# Patient Record
Sex: Male | Born: 1950 | Race: White | Hispanic: No | Marital: Married | Smoking: Current every day smoker
Health system: Southern US, Community
[De-identification: ages and names within clinical notes are randomized; demographics above are authoritative.]

## PROBLEM LIST (undated history)

## (undated) DIAGNOSIS — C4491 Basal cell carcinoma of skin, unspecified: Secondary | ICD-10-CM

## (undated) DIAGNOSIS — I10 Essential (primary) hypertension: Secondary | ICD-10-CM

## (undated) HISTORY — DX: Basal cell carcinoma of skin, unspecified: C44.91

## (undated) HISTORY — PX: COLONOSCOPY: SHX174

## (undated) HISTORY — DX: Essential (primary) hypertension: I10

---

## 2004-09-25 HISTORY — PX: UMBILICAL HERNIA REPAIR: SHX196

## 2004-10-21 ENCOUNTER — Encounter: Admission: RE | Admit: 2004-10-21 | Discharge: 2004-10-21 | Payer: Self-pay | Admitting: General Surgery

## 2004-10-24 ENCOUNTER — Ambulatory Visit (HOSPITAL_BASED_OUTPATIENT_CLINIC_OR_DEPARTMENT_OTHER): Admission: RE | Admit: 2004-10-24 | Discharge: 2004-10-24 | Payer: Self-pay | Admitting: General Surgery

## 2004-10-24 ENCOUNTER — Ambulatory Visit (HOSPITAL_COMMUNITY): Admission: RE | Admit: 2004-10-24 | Discharge: 2004-10-24 | Payer: Self-pay | Admitting: General Surgery

## 2011-08-15 ENCOUNTER — Encounter: Payer: Self-pay | Admitting: Internal Medicine

## 2011-08-24 ENCOUNTER — Ambulatory Visit (AMBULATORY_SURGERY_CENTER): Payer: BC Managed Care – PPO

## 2011-08-24 VITALS — Ht 68.0 in | Wt 163.0 lb

## 2011-08-24 DIAGNOSIS — Z1211 Encounter for screening for malignant neoplasm of colon: Secondary | ICD-10-CM

## 2011-08-24 MED ORDER — PEG-KCL-NACL-NASULF-NA ASC-C 100 G PO SOLR: 1.0000 | Freq: Once | ORAL | Status: DC

## 2011-08-25 ENCOUNTER — Encounter: Payer: Self-pay | Admitting: Internal Medicine

## 2011-09-07 ENCOUNTER — Encounter: Payer: Self-pay | Admitting: Internal Medicine

## 2011-09-07 ENCOUNTER — Ambulatory Visit (AMBULATORY_SURGERY_CENTER): Payer: BC Managed Care – PPO | Admitting: Internal Medicine

## 2011-09-07 VITALS — BP 130/67 | HR 62 | Temp 97.0°F | Resp 20 | Ht 68.0 in | Wt 163.0 lb

## 2011-09-07 DIAGNOSIS — D126 Benign neoplasm of colon, unspecified: Secondary | ICD-10-CM

## 2011-09-07 DIAGNOSIS — D128 Benign neoplasm of rectum: Secondary | ICD-10-CM

## 2011-09-07 DIAGNOSIS — D129 Benign neoplasm of anus and anal canal: Secondary | ICD-10-CM

## 2011-09-07 DIAGNOSIS — Z1211 Encounter for screening for malignant neoplasm of colon: Secondary | ICD-10-CM

## 2011-09-07 MED ORDER — SODIUM CHLORIDE 0.9 % IV SOLN: 500.0000 mL | INTRAVENOUS | Status: DC

## 2011-09-07 NOTE — Patient Instructions (Addendum)
Discharge instructions given with verbal understanding. Handout on polyps given. Resume previous medications. 

## 2011-09-07 NOTE — Progress Notes (Signed)
Patient did not experience any of the following events: a burn prior to discharge; a fall within the facility; wrong site/side/patient/procedure/implant event; or a hospital transfer or hospital admission upon discharge from the facility. (G8907) Patient did not have preoperative order for IV antibiotic SSI prophylaxis. (G8918)  

## 2011-09-07 NOTE — Op Note (Signed)
[facility] [address] [place], [place]  [address]  COLONOSCOPY PROCEDURE REPORT  PATIENT:  Duane Webster, Duane Webster  MR#:  [id number] BIRTHDATE:  1950-12-25, 60 yrs. old  GENDER:  male ENDOSCOPIST:  Hedwig Morton. Juanda Chance, MD REF. BY:  Lonie Peak, PA PROCEDURE DATE:  09/07/2011 PROCEDURE:  Colonoscopy with biopsy ASA CLASS:  Class I INDICATIONS:  colorectal cancer screening, average risk colon 10 years ago in [place]-?polyp MEDICATIONS:   These medications were titrated to patient response per physician's verbal order, Versed 6 mg, Fentanyl 50 mcg  DESCRIPTION OF PROCEDURE:   After the risks and benefits and of the procedure were explained, informed consent was obtained. Digital rectal exam was performed and revealed no rectal masses. The LB CF-[id number] endoscope was introduced through the anus and advanced to the cecum, which was identified by both the appendix and ileocecal valve.  The quality of the prep was good, using MoviPrep.  The instrument was then slowly withdrawn as the colon was fully examined. <<PROCEDUREIMAGES>>  FINDINGS:  A diminutive polyp was found in the rectum. 2-3 mm polyp The polyp was removed using cold biopsy forceps (see image5).  This was otherwise a normal examination of the colon (see image4, image3, image2, and image1).   Retroflexed views in the rectum revealed no abnormalities.    The scope was then withdrawn from the patient and the procedure completed.  COMPLICATIONS:  None ENDOSCOPIC IMPRESSION: 1) Diminutive polyp in the rectum 2) Otherwise normal examination RECOMMENDATIONS: 1) Await pathology results 2) High fiber diet.  REPEAT EXAM:  In 10 year(s) for.  ______________________________ Hedwig Morton. Juanda Chance, MD  CC:  n. eSIGNED:   Hedwig Morton. Krizia Flight at 09/07/2011 09:07 AM  Larwance Sachs, [phone]

## 2011-09-08 ENCOUNTER — Telehealth: Payer: Self-pay | Admitting: *Deleted

## 2011-09-08 NOTE — Telephone Encounter (Signed)

## 2011-09-13 ENCOUNTER — Encounter: Payer: Self-pay | Admitting: Internal Medicine

## 2016-08-02 ENCOUNTER — Encounter: Payer: Self-pay | Admitting: Gastroenterology

## 2016-08-31 DIAGNOSIS — H5200 Hypermetropia, unspecified eye: Secondary | ICD-10-CM | POA: Diagnosis not present

## 2016-08-31 DIAGNOSIS — Z0100 Encounter for examination of eyes and vision without abnormal findings: Secondary | ICD-10-CM | POA: Diagnosis not present

## 2016-08-31 DIAGNOSIS — H2513 Age-related nuclear cataract, bilateral: Secondary | ICD-10-CM | POA: Diagnosis not present

## 2017-02-01 DIAGNOSIS — D1801 Hemangioma of skin and subcutaneous tissue: Secondary | ICD-10-CM | POA: Diagnosis not present

## 2017-02-01 DIAGNOSIS — L821 Other seborrheic keratosis: Secondary | ICD-10-CM | POA: Diagnosis not present

## 2017-02-01 DIAGNOSIS — Z85828 Personal history of other malignant neoplasm of skin: Secondary | ICD-10-CM | POA: Diagnosis not present

## 2017-02-01 DIAGNOSIS — D2261 Melanocytic nevi of right upper limb, including shoulder: Secondary | ICD-10-CM | POA: Diagnosis not present

## 2017-02-01 DIAGNOSIS — D692 Other nonthrombocytopenic purpura: Secondary | ICD-10-CM | POA: Diagnosis not present

## 2017-02-01 DIAGNOSIS — L570 Actinic keratosis: Secondary | ICD-10-CM | POA: Diagnosis not present

## 2017-05-09 DIAGNOSIS — Z125 Encounter for screening for malignant neoplasm of prostate: Secondary | ICD-10-CM | POA: Diagnosis not present

## 2017-05-09 DIAGNOSIS — Z139 Encounter for screening, unspecified: Secondary | ICD-10-CM | POA: Diagnosis not present

## 2017-05-09 DIAGNOSIS — I10 Essential (primary) hypertension: Secondary | ICD-10-CM | POA: Diagnosis not present

## 2017-05-09 DIAGNOSIS — Z23 Encounter for immunization: Secondary | ICD-10-CM | POA: Diagnosis not present

## 2017-05-09 DIAGNOSIS — Z6828 Body mass index (BMI) 28.0-28.9, adult: Secondary | ICD-10-CM | POA: Diagnosis not present

## 2017-05-09 DIAGNOSIS — F17200 Nicotine dependence, unspecified, uncomplicated: Secondary | ICD-10-CM | POA: Diagnosis not present

## 2017-05-09 DIAGNOSIS — Z9181 History of falling: Secondary | ICD-10-CM | POA: Diagnosis not present

## 2017-05-09 DIAGNOSIS — Z0000 Encounter for general adult medical examination without abnormal findings: Secondary | ICD-10-CM | POA: Diagnosis not present

## 2017-05-09 DIAGNOSIS — E663 Overweight: Secondary | ICD-10-CM | POA: Diagnosis not present

## 2017-05-09 DIAGNOSIS — Z136 Encounter for screening for cardiovascular disorders: Secondary | ICD-10-CM | POA: Diagnosis not present

## 2017-05-09 DIAGNOSIS — Z1159 Encounter for screening for other viral diseases: Secondary | ICD-10-CM | POA: Diagnosis not present

## 2017-05-23 ENCOUNTER — Encounter: Payer: Self-pay | Admitting: Gastroenterology

## 2017-07-10 ENCOUNTER — Encounter: Payer: Self-pay | Admitting: Gastroenterology

## 2017-07-10 ENCOUNTER — Ambulatory Visit (AMBULATORY_SURGERY_CENTER): Payer: Self-pay

## 2017-07-10 VITALS — Ht 68.0 in | Wt 186.4 lb

## 2017-07-10 DIAGNOSIS — Z86010 Personal history of colonic polyps: Secondary | ICD-10-CM

## 2017-07-10 MED ORDER — NA SULFATE-K SULFATE-MG SULF 17.5-3.13-1.6 GM/177ML PO SOLN: ORAL | 0 refills | Status: DC

## 2017-07-10 NOTE — Progress Notes (Signed)
Per pt, no allergies to soy or egg products.Pt not taking any weight loss meds or using  O2 at home.   Pt refused Emmi video. 

## 2017-07-20 DIAGNOSIS — Z720 Tobacco use: Secondary | ICD-10-CM | POA: Diagnosis not present

## 2017-07-20 DIAGNOSIS — Z6829 Body mass index (BMI) 29.0-29.9, adult: Secondary | ICD-10-CM | POA: Diagnosis not present

## 2017-07-20 DIAGNOSIS — I10 Essential (primary) hypertension: Secondary | ICD-10-CM | POA: Diagnosis not present

## 2017-07-20 DIAGNOSIS — Z1331 Encounter for screening for depression: Secondary | ICD-10-CM | POA: Diagnosis not present

## 2017-07-20 DIAGNOSIS — Z23 Encounter for immunization: Secondary | ICD-10-CM | POA: Diagnosis not present

## 2017-07-24 ENCOUNTER — Ambulatory Visit (AMBULATORY_SURGERY_CENTER): Payer: Medicare HMO | Admitting: Gastroenterology

## 2017-07-24 ENCOUNTER — Encounter: Payer: Self-pay | Admitting: Gastroenterology

## 2017-07-24 VITALS — BP 100/57 | HR 62 | Temp 97.7°F | Resp 12 | Ht 68.0 in | Wt 186.0 lb

## 2017-07-24 DIAGNOSIS — D123 Benign neoplasm of transverse colon: Secondary | ICD-10-CM | POA: Diagnosis not present

## 2017-07-24 DIAGNOSIS — D122 Benign neoplasm of ascending colon: Secondary | ICD-10-CM

## 2017-07-24 DIAGNOSIS — Z1211 Encounter for screening for malignant neoplasm of colon: Secondary | ICD-10-CM

## 2017-07-24 DIAGNOSIS — Z86010 Personal history of colonic polyps: Secondary | ICD-10-CM | POA: Diagnosis not present

## 2017-07-24 DIAGNOSIS — K635 Polyp of colon: Secondary | ICD-10-CM | POA: Diagnosis not present

## 2017-07-24 MED ORDER — SODIUM CHLORIDE 0.9 % IV SOLN: 500.0000 mL | INTRAVENOUS | Status: DC

## 2017-07-24 NOTE — Op Note (Signed)
[facility] Patient Name: Duane Webster Procedure Date: 07/24/2017 7:57 AM MRN: [id number] Endoscopist: Mauri Pole , MD Age: 66 Referring MD:  Date of Birth: 1950/11/24 Gender: Male Account #: [id number] Procedure:                Colonoscopy Indications:              Surveillance: Personal history of adenomatous                            polyps on last colonoscopy > 5 years ago Medicines:                Monitored Anesthesia Care Procedure:                Pre-Anesthesia Assessment:                           - Prior to the procedure, a History and Physical                            was performed, and patient medications and                            allergies were reviewed. The patient's tolerance of                            previous anesthesia was also reviewed. The risks                            and benefits of the procedure and the sedation                            options and risks were discussed with the patient.                            All questions were answered, and informed consent                            was obtained. Prior Anticoagulants: The patient has                            taken no previous anticoagulant or antiplatelet                            agents. ASA Grade Assessment: II - A patient with                            mild systemic disease. After reviewing the risks                            and benefits, the patient was deemed in                            satisfactory condition to undergo the procedure.  After obtaining informed consent, the colonoscope                            was passed under direct vision. Throughout the                            procedure, the patient's blood pressure, pulse, and                            oxygen saturations were monitored continuously. The                            Colonoscope was introduced through the anus and                            advanced to the the  terminal ileum, with                            identification of the appendiceal orifice and IC                            valve. The colonoscopy was performed without                            difficulty. The patient tolerated the procedure                            well. The quality of the bowel preparation was                            excellent. The terminal ileum, ileocecal valve,                            appendiceal orifice, and rectum were photographed. Scope In: 8:18:15 AM Scope Out: 8:31:57 AM Scope Withdrawal Time: 0 hours 11 minutes 2 seconds  Total Procedure Duration: 0 hours 13 minutes 42 seconds  Findings:                 The perianal and digital rectal examinations were                            normal.                           A 5 mm polyp was found in the ascending colon. The                            polyp was sessile. The polyp was removed with a                            cold snare. Resection and retrieval were complete.                           A 2 mm polyp was found in the transverse colon.  The                            polyp was sessile. The polyp was removed with a                            cold biopsy forceps. Resection and retrieval were                            complete.                           A few small-mouthed diverticula were found in the                            sigmoid colon, descending colon, transverse colon,                            ascending colon and cecum.                           Non-bleeding internal hemorrhoids were found during                            retroflexion. The hemorrhoids were small.                           The exam was otherwise without abnormality. Complications:            No immediate complications. Estimated Blood Loss:     Estimated blood loss was minimal. Impression:               - One 5 mm polyp in the ascending colon, removed                            with a cold snare. Resected and retrieved.                            - One 2 mm polyp in the transverse colon, removed                            with a cold biopsy forceps. Resected and retrieved.                           - Mild diverticulosis in the sigmoid colon, in the                            descending colon, in the transverse colon, in the                            ascending colon and in the cecum.                           - Non-bleeding internal hemorrhoids.                           -  The examination was otherwise normal. Recommendation:           - Patient has a contact number available for                            emergencies. The signs and symptoms of potential                            delayed complications were discussed with the                            patient. Return to normal activities tomorrow.                            Written discharge instructions were provided to the                            patient.                           - Resume previous diet.                           - Continue present medications.                           - Await pathology results.                           - Repeat colonoscopy in 5-10 years for surveillance                            based on pathology results. Mauri Pole, MD 07/24/2017 8:37:37 AM This report has been signed electronically.

## 2017-07-24 NOTE — Progress Notes (Signed)
Pt's states no medical or surgical changes since previsit or office visit. 

## 2017-07-24 NOTE — Progress Notes (Signed)
To PACU, VSS. Report to RN.tb 

## 2017-07-24 NOTE — Patient Instructions (Signed)
   INFORMATION ON POLYPS,DIVERTICULOSIS ,AND HEMORRHOIDS GIVEN TO YOU TODAY  AWAIT PATHOLOGY REPORT ON POLYPS REMOVED    YOU HAD AN ENDOSCOPIC PROCEDURE TODAY AT [facility]:   Refer to the procedure report that was given to you for any specific questions about what was found during the examination.  If the procedure report does not answer your questions, please call your gastroenterologist to clarify.  If you requested that your care partner not be given the details of your procedure findings, then the procedure report has been included in a sealed envelope for you to review at your convenience later.  YOU SHOULD EXPECT: Some feelings of bloating in the abdomen. Passage of more gas than usual.  Walking can help get rid of the air that was put into your GI tract during the procedure and reduce the bloating. If you had a lower endoscopy (such as a colonoscopy or flexible sigmoidoscopy) you may notice spotting of blood in your stool or on the toilet paper. If you underwent a bowel prep for your procedure, you may not have a normal bowel movement for a few days.  Please Note:  You might notice some irritation and congestion in your nose or some drainage.  This is from the oxygen used during your procedure.  There is no need for concern and it should clear up in a day or so.  SYMPTOMS TO REPORT IMMEDIATELY:   Following lower endoscopy (colonoscopy or flexible sigmoidoscopy):  Excessive amounts of blood in the stool  Significant tenderness or worsening of abdominal pains  Swelling of the abdomen that is new, acute  Fever of 100F or higher    For urgent or emergent issues, a gastroenterologist can be reached at any hour by calling [phone].   DIET:  We do recommend a small meal at first, but then you may proceed to your regular diet.  Drink plenty of fluids but you should avoid alcoholic beverages for 24 hours.  ACTIVITY:  You should plan to take it easy for the rest of  today and you should NOT DRIVE or use heavy machinery until tomorrow (because of the sedation medicines used during the test).    FOLLOW UP: Our staff will call the number listed on your records the next business day following your procedure to check on you and address any questions or concerns that you may have regarding the information given to you following your procedure. If we do not reach you, we will leave a message.  However, if you are feeling well and you are not experiencing any problems, there is no need to return our call.  We will assume that you have returned to your regular daily activities without incident.  If any biopsies were taken you will be contacted by phone or by letter within the next 1-3 weeks.  Please call us at [phone] if you have not heard about the biopsies in 3 weeks.    SIGNATURES/CONFIDENTIALITY: You and/or your care partner have signed paperwork which will be entered into your electronic medical record.  These signatures attest to the fact that that the information above on your After Visit Summary has been reviewed and is understood.  Full responsibility of the confidentiality of this discharge information lies with you and/or your care-partner.

## 2017-07-24 NOTE — Progress Notes (Signed)
CRNA,T Gwenlyn Found  REPORTED PT HAD RARE pvc before and during procedure. Strip given to pt and encouraged pt and wife to report this to PCP.

## 2017-07-24 NOTE — Progress Notes (Signed)
Called to room to assist during endoscopic procedure.  Patient ID and intended procedure confirmed with present staff. Received instructions for my participation in the procedure from the performing physician.  

## 2017-07-25 ENCOUNTER — Telehealth: Payer: Self-pay

## 2017-07-25 NOTE — Telephone Encounter (Signed)
  Follow up Call-  Call back number 07/24/2017  Post procedure Call Back phone  # [phone]  Permission to leave phone message Yes  Some recent data might be hidden     Patient questions:  Do you have a fever, pain , or abdominal swelling? No. Pain Score  0 *  Have you tolerated food without any problems? Yes.    Have you been able to return to your normal activities? Yes.    Do you have any questions about your discharge instructions: Diet   No. Medications  No. Follow up visit  No.  Do you have questions or concerns about your Care? No.  Actions: * If pain score is 4 or above: No action needed, pain <4.

## 2017-08-03 ENCOUNTER — Encounter: Payer: Self-pay | Admitting: Gastroenterology

## 2017-08-07 ENCOUNTER — Telehealth: Payer: Self-pay

## 2017-08-07 NOTE — Telephone Encounter (Signed)
Contacted the office of [place], [place]. Request for a re-read or review of the specimen from accession number [id number]. The reading physician Dr Enid Cutter is off and expected to return next week.

## 2017-09-27 NOTE — Telephone Encounter (Signed)
Faxed a request to the pathology office requesting a review of the specimen.

## 2018-01-18 DIAGNOSIS — R9431 Abnormal electrocardiogram [ECG] [EKG]: Secondary | ICD-10-CM | POA: Diagnosis not present

## 2018-01-18 DIAGNOSIS — Z6829 Body mass index (BMI) 29.0-29.9, adult: Secondary | ICD-10-CM | POA: Diagnosis not present

## 2018-01-18 DIAGNOSIS — I10 Essential (primary) hypertension: Secondary | ICD-10-CM | POA: Diagnosis not present

## 2018-07-29 DIAGNOSIS — Z9181 History of falling: Secondary | ICD-10-CM | POA: Diagnosis not present

## 2018-07-29 DIAGNOSIS — Z6830 Body mass index (BMI) 30.0-30.9, adult: Secondary | ICD-10-CM | POA: Diagnosis not present

## 2018-07-29 DIAGNOSIS — Z87891 Personal history of nicotine dependence: Secondary | ICD-10-CM | POA: Diagnosis not present

## 2018-07-29 DIAGNOSIS — Z139 Encounter for screening, unspecified: Secondary | ICD-10-CM | POA: Diagnosis not present

## 2018-07-29 DIAGNOSIS — Z23 Encounter for immunization: Secondary | ICD-10-CM | POA: Diagnosis not present

## 2018-07-29 DIAGNOSIS — Z125 Encounter for screening for malignant neoplasm of prostate: Secondary | ICD-10-CM | POA: Diagnosis not present

## 2018-07-29 DIAGNOSIS — Z1339 Encounter for screening examination for other mental health and behavioral disorders: Secondary | ICD-10-CM | POA: Diagnosis not present

## 2018-07-29 DIAGNOSIS — Z1331 Encounter for screening for depression: Secondary | ICD-10-CM | POA: Diagnosis not present

## 2018-07-29 DIAGNOSIS — I10 Essential (primary) hypertension: Secondary | ICD-10-CM | POA: Diagnosis not present

## 2018-08-08 DIAGNOSIS — Z6829 Body mass index (BMI) 29.0-29.9, adult: Secondary | ICD-10-CM | POA: Diagnosis not present

## 2018-08-08 DIAGNOSIS — J069 Acute upper respiratory infection, unspecified: Secondary | ICD-10-CM | POA: Diagnosis not present

## 2019-03-20 DIAGNOSIS — I2510 Atherosclerotic heart disease of native coronary artery without angina pectoris: Secondary | ICD-10-CM | POA: Diagnosis not present

## 2019-03-20 DIAGNOSIS — F0390 Unspecified dementia without behavioral disturbance: Secondary | ICD-10-CM | POA: Diagnosis not present

## 2019-03-20 DIAGNOSIS — I10 Essential (primary) hypertension: Secondary | ICD-10-CM | POA: Diagnosis not present

## 2019-03-20 DIAGNOSIS — I4891 Unspecified atrial fibrillation: Secondary | ICD-10-CM | POA: Diagnosis not present

## 2019-03-20 DIAGNOSIS — D509 Iron deficiency anemia, unspecified: Secondary | ICD-10-CM | POA: Diagnosis not present

## 2019-03-20 DIAGNOSIS — R32 Unspecified urinary incontinence: Secondary | ICD-10-CM | POA: Diagnosis not present

## 2019-03-20 DIAGNOSIS — G250 Essential tremor: Secondary | ICD-10-CM | POA: Diagnosis not present

## 2019-04-08 DIAGNOSIS — I2510 Atherosclerotic heart disease of native coronary artery without angina pectoris: Secondary | ICD-10-CM | POA: Diagnosis not present

## 2019-04-08 DIAGNOSIS — I700 Atherosclerosis of aorta: Secondary | ICD-10-CM | POA: Diagnosis not present

## 2019-04-08 DIAGNOSIS — F17210 Nicotine dependence, cigarettes, uncomplicated: Secondary | ICD-10-CM | POA: Diagnosis not present

## 2019-04-08 DIAGNOSIS — Z87891 Personal history of nicotine dependence: Secondary | ICD-10-CM | POA: Diagnosis not present

## 2019-04-08 DIAGNOSIS — J439 Emphysema, unspecified: Secondary | ICD-10-CM | POA: Diagnosis not present

## 2019-07-18 DIAGNOSIS — H5203 Hypermetropia, bilateral: Secondary | ICD-10-CM | POA: Diagnosis not present

## 2019-11-11 DIAGNOSIS — Z9181 History of falling: Secondary | ICD-10-CM | POA: Diagnosis not present

## 2019-11-11 DIAGNOSIS — Z87891 Personal history of nicotine dependence: Secondary | ICD-10-CM | POA: Diagnosis not present

## 2019-11-11 DIAGNOSIS — Z6832 Body mass index (BMI) 32.0-32.9, adult: Secondary | ICD-10-CM | POA: Diagnosis not present

## 2019-11-11 DIAGNOSIS — Z139 Encounter for screening, unspecified: Secondary | ICD-10-CM | POA: Diagnosis not present

## 2019-11-11 DIAGNOSIS — I10 Essential (primary) hypertension: Secondary | ICD-10-CM | POA: Diagnosis not present

## 2019-11-11 DIAGNOSIS — Z1331 Encounter for screening for depression: Secondary | ICD-10-CM | POA: Diagnosis not present

## 2019-11-11 DIAGNOSIS — Z125 Encounter for screening for malignant neoplasm of prostate: Secondary | ICD-10-CM | POA: Diagnosis not present

## 2019-11-11 DIAGNOSIS — Z23 Encounter for immunization: Secondary | ICD-10-CM | POA: Diagnosis not present

## 2019-12-08 DIAGNOSIS — Z125 Encounter for screening for malignant neoplasm of prostate: Secondary | ICD-10-CM | POA: Diagnosis not present

## 2019-12-08 DIAGNOSIS — Z6832 Body mass index (BMI) 32.0-32.9, adult: Secondary | ICD-10-CM | POA: Diagnosis not present

## 2019-12-08 DIAGNOSIS — Z1331 Encounter for screening for depression: Secondary | ICD-10-CM | POA: Diagnosis not present

## 2019-12-08 DIAGNOSIS — Z0000 Encounter for general adult medical examination without abnormal findings: Secondary | ICD-10-CM | POA: Diagnosis not present

## 2019-12-08 DIAGNOSIS — Z9181 History of falling: Secondary | ICD-10-CM | POA: Diagnosis not present

## 2019-12-08 DIAGNOSIS — E785 Hyperlipidemia, unspecified: Secondary | ICD-10-CM | POA: Diagnosis not present

## 2020-03-17 DIAGNOSIS — Z6831 Body mass index (BMI) 31.0-31.9, adult: Secondary | ICD-10-CM | POA: Diagnosis not present

## 2020-03-17 DIAGNOSIS — L989 Disorder of the skin and subcutaneous tissue, unspecified: Secondary | ICD-10-CM | POA: Diagnosis not present

## 2020-03-31 DIAGNOSIS — L905 Scar conditions and fibrosis of skin: Secondary | ICD-10-CM | POA: Diagnosis not present

## 2020-03-31 DIAGNOSIS — L821 Other seborrheic keratosis: Secondary | ICD-10-CM | POA: Diagnosis not present

## 2020-03-31 DIAGNOSIS — L718 Other rosacea: Secondary | ICD-10-CM | POA: Diagnosis not present

## 2020-03-31 DIAGNOSIS — L570 Actinic keratosis: Secondary | ICD-10-CM | POA: Diagnosis not present

## 2020-03-31 DIAGNOSIS — L814 Other melanin hyperpigmentation: Secondary | ICD-10-CM | POA: Diagnosis not present

## 2020-03-31 DIAGNOSIS — D225 Melanocytic nevi of trunk: Secondary | ICD-10-CM | POA: Diagnosis not present

## 2020-03-31 DIAGNOSIS — Z85828 Personal history of other malignant neoplasm of skin: Secondary | ICD-10-CM | POA: Diagnosis not present

## 2020-06-14 DIAGNOSIS — Z23 Encounter for immunization: Secondary | ICD-10-CM | POA: Diagnosis not present

## 2020-06-14 DIAGNOSIS — I10 Essential (primary) hypertension: Secondary | ICD-10-CM | POA: Diagnosis not present

## 2020-06-14 DIAGNOSIS — Z87891 Personal history of nicotine dependence: Secondary | ICD-10-CM | POA: Diagnosis not present

## 2020-06-14 DIAGNOSIS — Z6831 Body mass index (BMI) 31.0-31.9, adult: Secondary | ICD-10-CM | POA: Diagnosis not present

## 2020-06-28 DIAGNOSIS — J432 Centrilobular emphysema: Secondary | ICD-10-CM | POA: Diagnosis not present

## 2020-06-28 DIAGNOSIS — J439 Emphysema, unspecified: Secondary | ICD-10-CM | POA: Diagnosis not present

## 2020-06-28 DIAGNOSIS — I2510 Atherosclerotic heart disease of native coronary artery without angina pectoris: Secondary | ICD-10-CM | POA: Diagnosis not present

## 2020-06-28 DIAGNOSIS — I700 Atherosclerosis of aorta: Secondary | ICD-10-CM | POA: Diagnosis not present

## 2020-06-28 DIAGNOSIS — F17210 Nicotine dependence, cigarettes, uncomplicated: Secondary | ICD-10-CM | POA: Diagnosis not present

## 2020-12-09 DIAGNOSIS — Z139 Encounter for screening, unspecified: Secondary | ICD-10-CM | POA: Diagnosis not present

## 2020-12-09 DIAGNOSIS — Z9181 History of falling: Secondary | ICD-10-CM | POA: Diagnosis not present

## 2020-12-09 DIAGNOSIS — Z1321 Encounter for screening for nutritional disorder: Secondary | ICD-10-CM | POA: Diagnosis not present

## 2020-12-09 DIAGNOSIS — Z0000 Encounter for general adult medical examination without abnormal findings: Secondary | ICD-10-CM | POA: Diagnosis not present

## 2020-12-09 DIAGNOSIS — Z1331 Encounter for screening for depression: Secondary | ICD-10-CM | POA: Diagnosis not present

## 2020-12-09 DIAGNOSIS — E785 Hyperlipidemia, unspecified: Secondary | ICD-10-CM | POA: Diagnosis not present

## 2020-12-28 DIAGNOSIS — B351 Tinea unguium: Secondary | ICD-10-CM | POA: Diagnosis not present

## 2020-12-28 DIAGNOSIS — Z6831 Body mass index (BMI) 31.0-31.9, adult: Secondary | ICD-10-CM | POA: Diagnosis not present

## 2020-12-28 DIAGNOSIS — Z87891 Personal history of nicotine dependence: Secondary | ICD-10-CM | POA: Diagnosis not present

## 2020-12-28 DIAGNOSIS — I10 Essential (primary) hypertension: Secondary | ICD-10-CM | POA: Diagnosis not present

## 2021-02-08 DIAGNOSIS — Z79899 Other long term (current) drug therapy: Secondary | ICD-10-CM | POA: Diagnosis not present

## 2021-05-05 DIAGNOSIS — L821 Other seborrheic keratosis: Secondary | ICD-10-CM | POA: Diagnosis not present

## 2021-05-05 DIAGNOSIS — Z85828 Personal history of other malignant neoplasm of skin: Secondary | ICD-10-CM | POA: Diagnosis not present

## 2021-05-05 DIAGNOSIS — D229 Melanocytic nevi, unspecified: Secondary | ICD-10-CM | POA: Diagnosis not present

## 2021-05-05 DIAGNOSIS — L819 Disorder of pigmentation, unspecified: Secondary | ICD-10-CM | POA: Diagnosis not present

## 2021-05-05 DIAGNOSIS — L905 Scar conditions and fibrosis of skin: Secondary | ICD-10-CM | POA: Diagnosis not present

## 2021-05-05 DIAGNOSIS — L570 Actinic keratosis: Secondary | ICD-10-CM | POA: Diagnosis not present

## 2021-06-30 DIAGNOSIS — Z23 Encounter for immunization: Secondary | ICD-10-CM | POA: Diagnosis not present

## 2021-06-30 DIAGNOSIS — Z87891 Personal history of nicotine dependence: Secondary | ICD-10-CM | POA: Diagnosis not present

## 2021-06-30 DIAGNOSIS — Z6830 Body mass index (BMI) 30.0-30.9, adult: Secondary | ICD-10-CM | POA: Diagnosis not present

## 2021-06-30 DIAGNOSIS — I10 Essential (primary) hypertension: Secondary | ICD-10-CM | POA: Diagnosis not present

## 2021-06-30 DIAGNOSIS — Z125 Encounter for screening for malignant neoplasm of prostate: Secondary | ICD-10-CM | POA: Diagnosis not present

## 2021-08-04 DIAGNOSIS — R972 Elevated prostate specific antigen [PSA]: Secondary | ICD-10-CM | POA: Diagnosis not present

## 2021-08-17 DIAGNOSIS — Z122 Encounter for screening for malignant neoplasm of respiratory organs: Secondary | ICD-10-CM | POA: Diagnosis not present

## 2021-08-17 DIAGNOSIS — Z87891 Personal history of nicotine dependence: Secondary | ICD-10-CM | POA: Diagnosis not present

## 2021-08-17 DIAGNOSIS — F17210 Nicotine dependence, cigarettes, uncomplicated: Secondary | ICD-10-CM | POA: Diagnosis not present

## 2021-09-22 DIAGNOSIS — N402 Nodular prostate without lower urinary tract symptoms: Secondary | ICD-10-CM | POA: Diagnosis not present

## 2021-09-22 DIAGNOSIS — R972 Elevated prostate specific antigen [PSA]: Secondary | ICD-10-CM | POA: Diagnosis not present

## 2021-10-20 DIAGNOSIS — S46211A Strain of muscle, fascia and tendon of other parts of biceps, right arm, initial encounter: Secondary | ICD-10-CM | POA: Diagnosis not present

## 2021-10-24 DIAGNOSIS — M25521 Pain in right elbow: Secondary | ICD-10-CM | POA: Diagnosis not present

## 2021-10-28 DIAGNOSIS — C61 Malignant neoplasm of prostate: Secondary | ICD-10-CM | POA: Diagnosis not present

## 2021-10-28 DIAGNOSIS — R972 Elevated prostate specific antigen [PSA]: Secondary | ICD-10-CM | POA: Diagnosis not present

## 2021-11-04 DIAGNOSIS — N402 Nodular prostate without lower urinary tract symptoms: Secondary | ICD-10-CM | POA: Diagnosis not present

## 2021-11-04 DIAGNOSIS — C61 Malignant neoplasm of prostate: Secondary | ICD-10-CM | POA: Diagnosis not present

## 2021-11-07 DIAGNOSIS — S46211A Strain of muscle, fascia and tendon of other parts of biceps, right arm, initial encounter: Secondary | ICD-10-CM | POA: Diagnosis not present

## 2021-11-08 ENCOUNTER — Telehealth: Payer: Self-pay | Admitting: Radiation Oncology

## 2021-11-08 NOTE — Telephone Encounter (Signed)
Called patient to schedule a consultation w. Dr. Manning. No answer, LVM for a return call.  ?

## 2021-11-09 NOTE — Progress Notes (Signed)
GU Location of Tumor / Histology: Prostate Ca  If Prostate Cancer, Gleason Score is (4 + 3) and PSA is (5.3 as of 10/2021)  Biopsies  Dr. Gloriann Loan      Past/Anticipated interventions by urology, if any:     Past/Anticipated interventions by medical oncology, if any:   Weight changes, if any:  No  IPSS:  6 SHIM:  25  Bowel/Bladder complaints, if any:  No  Nausea/Vomiting, if any: No  Pain issues, if any:  0/10  SAFETY ISSUES: Prior radiation? No Pacemaker/ICD? No Possible current pregnancy?  Male Is the patient on methotrexate? No  Current Complaints / other details:  Need more information on treatment options.

## 2021-11-13 DIAGNOSIS — C61 Malignant neoplasm of prostate: Secondary | ICD-10-CM | POA: Insufficient documentation

## 2021-11-13 NOTE — Progress Notes (Signed)
Radiation Oncology         [phone] ________________________________  Initial Outpatient Consultation  Name: Duane Webster MRN: [id number]  Date: 11/14/2021  DOB: [birth year]  WC:HENIDP, Maia Breslow, MD   REFERRING PHYSICIAN: Lucas Mallow, MD  DIAGNOSIS: 71 y.o. gentleman with Stage T2a adenocarcinoma of the prostate with Gleason score of 3+4, and PSA of 5.3.    ICD-10-CM   1. Malignant neoplasm of prostate ([place])  C61       HISTORY OF PRESENT ILLNESS: Duane Webster is a 71 y.o. male with a diagnosis of prostate cancer. He was noted to have an elevated PSA of 5.3 by his primary care PA Cyndi Bender.  Accordingly, he was referred for evaluation in urology by Dr. Gloriann Loan on 09/22/21,  digital rectal examination was performed at that time revealing a small BB size nodule in the midline.  The patient proceeded to transrectal ultrasound with 12 biopsies of the prostate on 10/28/21.  The prostate volume measured 20.9 cc.  Out of 12 core biopsies, 3 were positive.  The maximum Gleason score was 3+4, and this was seen in the left and right apex laterally with Gleason 3+3 in the left lateral base..  The patient reviewed the biopsy results with his urologist and he has kindly been referred today for discussion of potential radiation treatment options.   PREVIOUS RADIATION THERAPY: No  PAST MEDICAL HISTORY:  Past Medical History:  Diagnosis Date   Basal cell carcinoma    right side face   Hypertension       PAST SURGICAL HISTORY: Past Surgical History:  Procedure Laterality Date   COLONOSCOPY     UMBILICAL HERNIA REPAIR  2006    FAMILY HISTORY:  Family History  Problem Relation Age of Onset   Colon cancer Neg Hx     SOCIAL HISTORY:  Social History   Socioeconomic History   Marital status: Married    Spouse name: Not on file   Number of children: Not on file   Years of education: Not on file   Highest education level: Not on file   Occupational History   Not on file  Tobacco Use   Smoking status: Every Day    Packs/day: 1.00    Types: Cigarettes   Smokeless tobacco: Never  Vaping Use   Vaping Use: Never used  Substance and Sexual Activity   Alcohol use: Yes    Alcohol/week: 4.0 standard drinks    Types: 4 Cans of beer per week   Drug use: No   Sexual activity: Not on file  Other Topics Concern   Not on file  Social History Narrative   Not on file   Social Determinants of Health   Financial Resource Strain: Not on file  Food Insecurity: Not on file  Transportation Needs: Not on file  Physical Activity: Not on file  Stress: Not on file  Social Connections: Not on file  Intimate Partner Violence: Not on file    ALLERGIES: Codeine  MEDICATIONS:  Current Outpatient Medications  Medication Sig Dispense Refill   losartan (COZAAR) 25 MG tablet Take 25 mg by mouth daily.     PFIZER COVID-19 VAC BIVALENT injection      Current Facility-Administered Medications  Medication Dose Route Frequency Provider Last Rate Last Admin   0.9 %  sodium chloride infusion  500 mL Intravenous Continuous Nandigam, Venia Minks, MD        REVIEW OF SYSTEMS:  On review of systems, the patient reports that he is doing well overall. He denies any chest pain, shortness of breath, cough, fevers, chills, night sweats, unintended weight changes. He denies any bowel disturbances, and denies abdominal pain, nausea or vomiting. He denies any new musculoskeletal or joint aches or pains. His IPSS was Total Score: 6, indicating mild urinary symptoms (Reference 0-7 mild, 8-19 moderate, 20-35 severe).  His SHIM: 25, indicating he does not have any erectile dysfunction (Reference - 22-25 None, 17-21 Mild, 8-16 Moderate, 1-7 Severe). A complete review of systems is obtained and is otherwise negative.   PHYSICAL EXAM:  Wt Readings from Last 3 Encounters:  11/14/21 203 lb (92.1 kg)  07/24/17 186 lb (84.4 kg)  07/10/17 186 lb 6.4 oz (84.6 kg)    Temp Readings from Last 3 Encounters:  11/14/21 97.7 F (36.5 C)  07/24/17 97.7 F (36.5 C)  09/07/11 97 F (36.1 C)   BP Readings from Last 3 Encounters:  11/14/21 130/68  07/24/17 (!) 100/57  09/07/11 130/67   Pulse Readings from Last 3 Encounters:  11/14/21 73  07/24/17 62  09/07/11 62   Pain Assessment Pain Score: 0-No pain/10  In general this is a well appearing gentleman in no acute distress. He's alert and oriented x4 and appropriate throughout the examination. Cardiopulmonary assessment is negative for acute distress, and he exhibits normal effort.    KPS = 100  100 - Normal; no complaints; no evidence of disease. 90   - Able to carry on normal activity; minor signs or symptoms of disease. 80   - Normal activity with effort; some signs or symptoms of disease. 38   - Cares for self; unable to carry on normal activity or to do active work. 60   - Requires occasional assistance, but is able to care for most of his personal needs. 50   - Requires considerable assistance and frequent medical care. 14   - Disabled; requires special care and assistance. 72   - Severely disabled; hospital admission is indicated although death not imminent. 51   - Very sick; hospital admission necessary; active supportive treatment necessary. 10   - Moribund; fatal processes progressing rapidly. 0     - Dead  Karnofsky DA, Abelmann WH, Craver LS and Burchenal JH [phone]) The use of the nitrogen mustards in the palliative treatment of carcinoma: with particular reference to bronchogenic carcinoma Cancer 1 634-56  LABORATORY DATA:  No results found for: WBC, HGB, HCT, MCV, PLT No results found for: NA, K, CL, CO2 No results found for: ALT, AST, GGT, ALKPHOS, BILITOT   RADIOGRAPHY: No results found.    IMPRESSION/PLAN: 1. 71 y.o. gentleman with Stage T2a adenocarcinoma of the prostate with Gleason score of 3+4, and PSA of 5.3.  We discussed the patient's workup and outlined the nature  of prostate cancer in this setting. The patient's T stage, Gleason's score, and PSA put him into the Favorable Intermediate risk group. Accordingly, he is eligible for a variety of potential treatment options including brachytherapy, 5.5 weeks of external radiation, or prostatectomy. We discussed the available radiation techniques, and focused on the details and logistics of delivery.  We discussed and outlined the risks, benefits, short and long-term effects associated with radiotherapy and compared and contrasted these with prostatectomy. We discussed the role of SpaceOAR gel in reducing the rectal toxicity associated with radiotherapy.   He appears to have a good understanding of his disease and our treatment recommendations which are of curative  intent.  He was encouraged to ask questions that were answered to his stated satisfaction.  At the conclusion of our conversation, the patient is most interested in moving forward with active surveillance.  He has follow-up scheduled with Dr. Gloriann Loan  We personally spent 60 minutes in this encounter including chart review, reviewing radiological studies, meeting face-to-face with the patient, entering orders and completing documentation.      Tyler Pita, MD  [facility]   Radiation Oncology Direct Dial: [phone]   Fax: [phone] [email or web address]   Skype   LinkedIn

## 2021-11-14 ENCOUNTER — Ambulatory Visit
Admission: RE | Admit: 2021-11-14 | Discharge: 2021-11-14 | Disposition: A | Discharge status: Home / Self Care | Payer: Medicare HMO | Source: Ambulatory Visit | Attending: Radiation Oncology | Admitting: Radiation Oncology

## 2021-11-14 ENCOUNTER — Other Ambulatory Visit: Payer: Self-pay

## 2021-11-14 VITALS — BP 130/68 | HR 73 | Temp 97.7°F | Resp 20 | Ht 68.0 in | Wt 203.0 lb

## 2021-11-14 DIAGNOSIS — I10 Essential (primary) hypertension: Secondary | ICD-10-CM | POA: Insufficient documentation

## 2021-11-14 DIAGNOSIS — Z08 Encounter for follow-up examination after completed treatment for malignant neoplasm: Secondary | ICD-10-CM | POA: Diagnosis not present

## 2021-11-14 DIAGNOSIS — F17210 Nicotine dependence, cigarettes, uncomplicated: Secondary | ICD-10-CM | POA: Diagnosis not present

## 2021-11-14 DIAGNOSIS — Z85828 Personal history of other malignant neoplasm of skin: Secondary | ICD-10-CM | POA: Insufficient documentation

## 2021-11-14 DIAGNOSIS — C61 Malignant neoplasm of prostate: Secondary | ICD-10-CM | POA: Insufficient documentation

## 2021-11-14 DIAGNOSIS — Z79899 Other long term (current) drug therapy: Secondary | ICD-10-CM | POA: Insufficient documentation

## 2021-11-14 NOTE — Progress Notes (Signed)
Introduced myself to the patient, and his wife, as the prostate nurse navigator.  No barriers to care identified at this time.  He is here to discuss his radiation treatment options.  I gave him my business card and asked him to call me with questions or concerns.  Verbalized understanding.  

## 2021-12-12 DIAGNOSIS — Z9181 History of falling: Secondary | ICD-10-CM | POA: Diagnosis not present

## 2021-12-12 DIAGNOSIS — Z6830 Body mass index (BMI) 30.0-30.9, adult: Secondary | ICD-10-CM | POA: Diagnosis not present

## 2021-12-12 DIAGNOSIS — Z0000 Encounter for general adult medical examination without abnormal findings: Secondary | ICD-10-CM | POA: Diagnosis not present

## 2021-12-12 DIAGNOSIS — E785 Hyperlipidemia, unspecified: Secondary | ICD-10-CM | POA: Diagnosis not present

## 2021-12-12 DIAGNOSIS — Z1331 Encounter for screening for depression: Secondary | ICD-10-CM | POA: Diagnosis not present

## 2021-12-12 DIAGNOSIS — E669 Obesity, unspecified: Secondary | ICD-10-CM | POA: Diagnosis not present

## 2021-12-16 DIAGNOSIS — R351 Nocturia: Secondary | ICD-10-CM | POA: Diagnosis not present

## 2021-12-16 DIAGNOSIS — R3915 Urgency of urination: Secondary | ICD-10-CM | POA: Diagnosis not present

## 2021-12-16 DIAGNOSIS — C61 Malignant neoplasm of prostate: Secondary | ICD-10-CM | POA: Diagnosis not present

## 2022-02-03 ENCOUNTER — Other Ambulatory Visit: Payer: Self-pay | Admitting: Urology

## 2022-02-03 DIAGNOSIS — R972 Elevated prostate specific antigen [PSA]: Secondary | ICD-10-CM

## 2022-02-15 DIAGNOSIS — Z86010 Personal history of colonic polyps: Secondary | ICD-10-CM | POA: Diagnosis not present

## 2022-02-15 DIAGNOSIS — C61 Malignant neoplasm of prostate: Secondary | ICD-10-CM | POA: Diagnosis not present

## 2022-02-15 DIAGNOSIS — I10 Essential (primary) hypertension: Secondary | ICD-10-CM | POA: Diagnosis not present

## 2022-02-15 DIAGNOSIS — Z139 Encounter for screening, unspecified: Secondary | ICD-10-CM | POA: Diagnosis not present

## 2022-03-03 ENCOUNTER — Ambulatory Visit
Admission: RE | Admit: 2022-03-03 | Discharge: 2022-03-03 | Disposition: A | Discharge status: Home / Self Care | Payer: Medicare HMO | Source: Ambulatory Visit | Attending: Urology | Admitting: Urology

## 2022-03-03 DIAGNOSIS — C61 Malignant neoplasm of prostate: Secondary | ICD-10-CM | POA: Diagnosis not present

## 2022-03-03 DIAGNOSIS — R972 Elevated prostate specific antigen [PSA]: Secondary | ICD-10-CM

## 2022-03-03 DIAGNOSIS — N402 Nodular prostate without lower urinary tract symptoms: Secondary | ICD-10-CM | POA: Diagnosis not present

## 2022-03-03 DIAGNOSIS — K5730 Diverticulosis of large intestine without perforation or abscess without bleeding: Secondary | ICD-10-CM | POA: Diagnosis not present

## 2022-03-03 MED ORDER — GADOBENATE DIMEGLUMINE 529 MG/ML IV SOLN
18.0000 mL | Freq: Once | INTRAVENOUS | Status: AC | PRN
  Administered 2022-03-03: 18 mL via INTRAVENOUS

## 2022-03-16 DIAGNOSIS — C61 Malignant neoplasm of prostate: Secondary | ICD-10-CM | POA: Diagnosis not present

## 2022-03-23 DIAGNOSIS — C61 Malignant neoplasm of prostate: Secondary | ICD-10-CM | POA: Diagnosis not present

## 2022-07-19 ENCOUNTER — Encounter: Payer: Self-pay | Admitting: Gastroenterology

## 2022-07-25 DIAGNOSIS — L570 Actinic keratosis: Secondary | ICD-10-CM | POA: Diagnosis not present

## 2022-07-25 DIAGNOSIS — L814 Other melanin hyperpigmentation: Secondary | ICD-10-CM | POA: Diagnosis not present

## 2022-07-25 DIAGNOSIS — Z85828 Personal history of other malignant neoplasm of skin: Secondary | ICD-10-CM | POA: Diagnosis not present

## 2022-07-25 DIAGNOSIS — L821 Other seborrheic keratosis: Secondary | ICD-10-CM | POA: Diagnosis not present

## 2022-07-25 DIAGNOSIS — Z08 Encounter for follow-up examination after completed treatment for malignant neoplasm: Secondary | ICD-10-CM | POA: Diagnosis not present

## 2022-07-25 DIAGNOSIS — D225 Melanocytic nevi of trunk: Secondary | ICD-10-CM | POA: Diagnosis not present

## 2022-09-05 DIAGNOSIS — H524 Presbyopia: Secondary | ICD-10-CM | POA: Diagnosis not present

## 2022-09-06 DIAGNOSIS — I10 Essential (primary) hypertension: Secondary | ICD-10-CM | POA: Diagnosis not present

## 2022-09-06 DIAGNOSIS — C61 Malignant neoplasm of prostate: Secondary | ICD-10-CM | POA: Diagnosis not present

## 2022-09-06 DIAGNOSIS — Z87891 Personal history of nicotine dependence: Secondary | ICD-10-CM | POA: Diagnosis not present

## 2022-09-06 DIAGNOSIS — Z23 Encounter for immunization: Secondary | ICD-10-CM | POA: Diagnosis not present

## 2022-09-13 DIAGNOSIS — C61 Malignant neoplasm of prostate: Secondary | ICD-10-CM | POA: Diagnosis not present

## 2022-09-20 DIAGNOSIS — C61 Malignant neoplasm of prostate: Secondary | ICD-10-CM | POA: Diagnosis not present

## 2022-09-20 DIAGNOSIS — R3915 Urgency of urination: Secondary | ICD-10-CM | POA: Diagnosis not present

## 2022-09-20 DIAGNOSIS — N403 Nodular prostate with lower urinary tract symptoms: Secondary | ICD-10-CM | POA: Diagnosis not present

## 2022-09-27 DIAGNOSIS — J432 Centrilobular emphysema: Secondary | ICD-10-CM | POA: Diagnosis not present

## 2022-09-27 DIAGNOSIS — I2510 Atherosclerotic heart disease of native coronary artery without angina pectoris: Secondary | ICD-10-CM | POA: Diagnosis not present

## 2022-09-27 DIAGNOSIS — F17210 Nicotine dependence, cigarettes, uncomplicated: Secondary | ICD-10-CM | POA: Diagnosis not present

## 2022-09-27 DIAGNOSIS — Z122 Encounter for screening for malignant neoplasm of respiratory organs: Secondary | ICD-10-CM | POA: Diagnosis not present

## 2022-09-27 DIAGNOSIS — Z87891 Personal history of nicotine dependence: Secondary | ICD-10-CM | POA: Diagnosis not present

## 2022-09-27 DIAGNOSIS — I700 Atherosclerosis of aorta: Secondary | ICD-10-CM | POA: Diagnosis not present

## 2023-01-04 DIAGNOSIS — L02213 Cutaneous abscess of chest wall: Secondary | ICD-10-CM | POA: Diagnosis not present

## 2023-01-04 DIAGNOSIS — L02811 Cutaneous abscess of head [any part, except face]: Secondary | ICD-10-CM | POA: Diagnosis not present

## 2023-03-12 DIAGNOSIS — Z139 Encounter for screening, unspecified: Secondary | ICD-10-CM | POA: Diagnosis not present

## 2023-03-12 DIAGNOSIS — L02211 Cutaneous abscess of abdominal wall: Secondary | ICD-10-CM | POA: Diagnosis not present

## 2023-03-12 DIAGNOSIS — Z9181 History of falling: Secondary | ICD-10-CM | POA: Diagnosis not present

## 2023-03-12 DIAGNOSIS — J439 Emphysema, unspecified: Secondary | ICD-10-CM | POA: Diagnosis not present

## 2023-03-12 DIAGNOSIS — I10 Essential (primary) hypertension: Secondary | ICD-10-CM | POA: Diagnosis not present

## 2023-03-12 DIAGNOSIS — Z1331 Encounter for screening for depression: Secondary | ICD-10-CM | POA: Diagnosis not present

## 2023-03-12 DIAGNOSIS — I2510 Atherosclerotic heart disease of native coronary artery without angina pectoris: Secondary | ICD-10-CM | POA: Diagnosis not present

## 2023-03-12 DIAGNOSIS — C61 Malignant neoplasm of prostate: Secondary | ICD-10-CM | POA: Diagnosis not present

## 2023-03-12 DIAGNOSIS — I700 Atherosclerosis of aorta: Secondary | ICD-10-CM | POA: Diagnosis not present

## 2023-03-19 DIAGNOSIS — C61 Malignant neoplasm of prostate: Secondary | ICD-10-CM | POA: Diagnosis not present

## 2023-04-17 DIAGNOSIS — E7800 Pure hypercholesterolemia, unspecified: Secondary | ICD-10-CM | POA: Diagnosis not present

## 2023-04-17 DIAGNOSIS — R748 Abnormal levels of other serum enzymes: Secondary | ICD-10-CM | POA: Diagnosis not present

## 2023-04-26 IMAGING — MR MR PROSTATE WO/W CM
12 series · 48 of 48 positions shown · IV contrast (multihance)
Comparison: None Available.

CLINICAL DATA: Prostate carcinoma, Gleason score 3+4=7.

EXAM:
MR PROSTATE WITHOUT AND WITH CONTRAST
TECHNIQUE: Multiplanar multisequence MRI images were obtained of the pelvis
centered about the prostate. Pre and post contrast images were
obtained.
CONTRAST:  18mL MULTIHANCE GADOBENATE DIMEGLUMINE 529 MG/ML IV SOLN

[Series 3: T2 · coronal · 3.0mm · 0.56mm/px · 1 of 23 slices shown (1 of 3)]
[im 1/23]
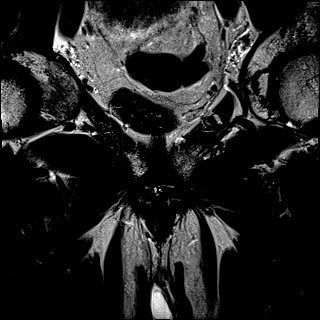

[Series 4: T1 · axial · 5.0mm · 1.25mm/px · z∈[-72,+143]mm · 2 of 88 slices shown]
[im 1/88]
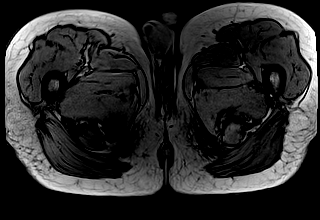
[im 88/88]
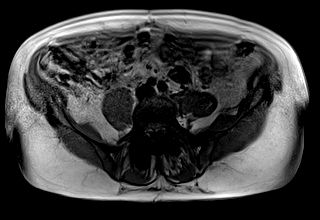

[Series 5: DWI · axial · 3.0mm · 1.75mm/px · 1 of 65 slices shown (1 of 3)]
[im 1/65]
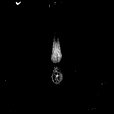

[Series 6: DWI · axial · 3.0mm · 1.75mm/px · 1 of 22 slices shown (2 of 3)]
[im 1/22]
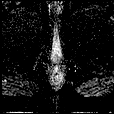

[Series 7: DWI · axial · 3.0mm · 1.75mm/px · 1 of 22 slices shown (3 of 3)]
[im 1/22]
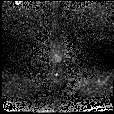

[Series 8: T2 · axial · 3.0mm · 0.56mm/px · 1 of 23 slices shown (2 of 3)]
[im 1/23]
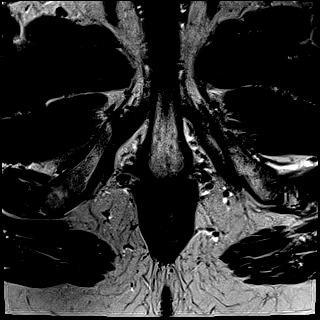

[Series 9: T2 · axial · 1.0mm · 1.04mm/px · z∈[-51,+20]mm · 2 of 72 slices shown (3 of 3)]
[im 1/72]
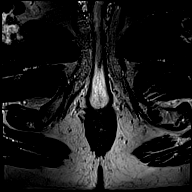
[im 72/72]
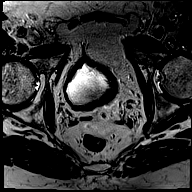

[Series 10: pre t1_twist_tra_dyn · axial · non-contrast · 3.5mm · 0.83mm/px · 1 of 20 slices shown]
[im 1/20]
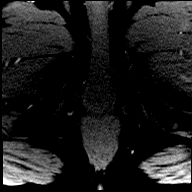

[Series 11: post t1_twist_tra_dyn-copy center · axial · non-contrast · 3.5mm · 0.83mm/px · z∈[-48,+18]mm · 17 of 600 slices shown]
[im 1/600]
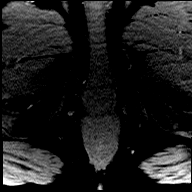
[im 38/600]
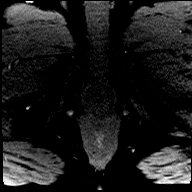
[im 75/600]
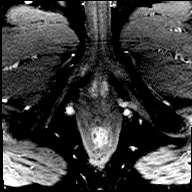
[im 113/600]
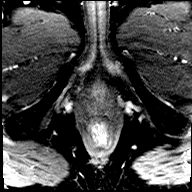
[im 150/600]
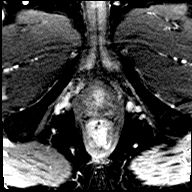
[im 188/600]
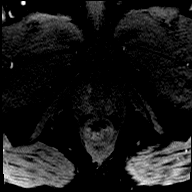
[im 225/600]
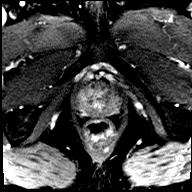
[im 263/600]
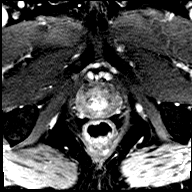
[im 300/600]
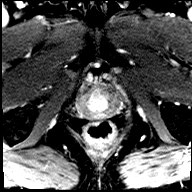
[im 337/600]
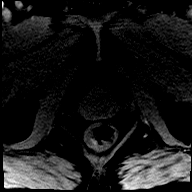
[im 375/600]
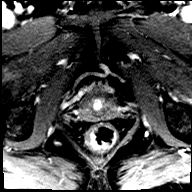
[im 412/600]
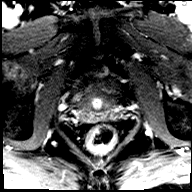
[im 450/600]
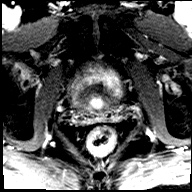
[im 487/600]
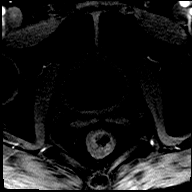
[im 525/600]
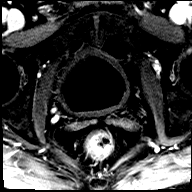
[im 562/600]
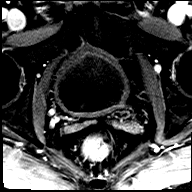
[im 600/600]
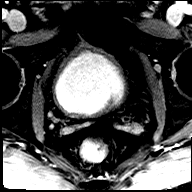

[Series 12: post t1_twist_tra_dyn-copy cent_sub · axial · 3.5mm · 0.83mm/px · z∈[-48,+18]mm · 17 of 580 slices shown]
[im 1/580]
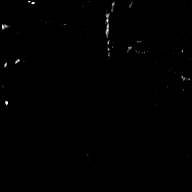
[im 37/580]
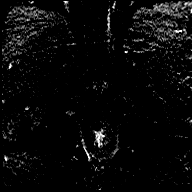
[im 73/580]
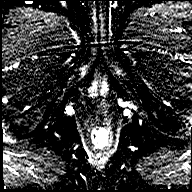
[im 109/580]
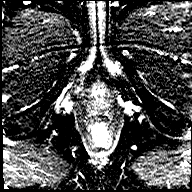
[im 145/580]
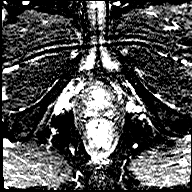
[im 181/580]
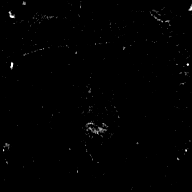
[im 218/580]
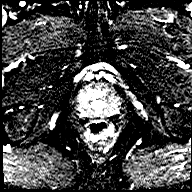
[im 254/580]
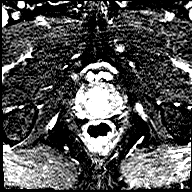
[im 290/580]
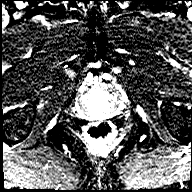
[im 326/580]
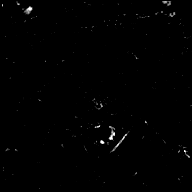
[im 362/580]
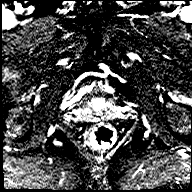
[im 399/580]
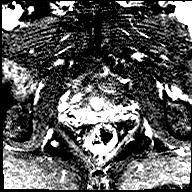
[im 435/580]
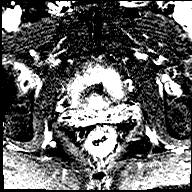
[im 471/580]
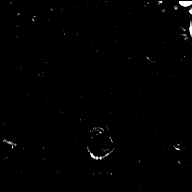
[im 507/580]
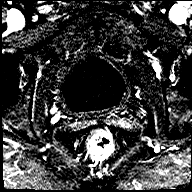
[im 543/580]
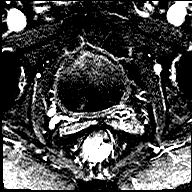
[im 580/580]
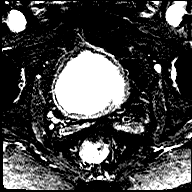

[Series 13: t1_vibe_dixon_tra_f · axial · 2.5mm · 0.91mm/px · z∈[-64,+134]mm · 2 of 80 slices shown]
[im 1/80]
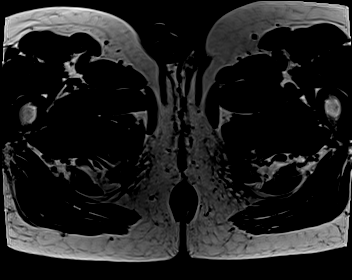
[im 80/80]
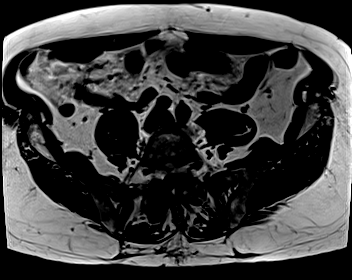

[Series 14: t1_vibe_dixon_tra_w · axial · 2.5mm · 0.91mm/px · z∈[-64,+134]mm · 2 of 80 slices shown]
[im 1/80]
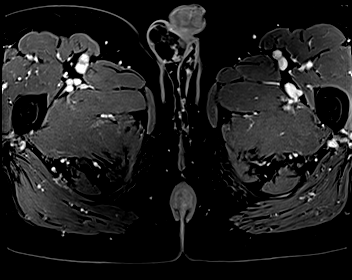
[im 80/80]
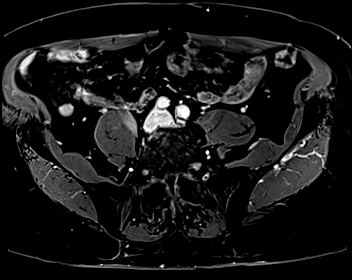

[48 of 48 positions shown; findings below may reference images not displayed]

FINDINGS: Prostate:

-- Peripheral Zone: A 7 x 7 mm T2 hypointense nodule is seen in the
right anterior mid gland (image [DATE]). This lesion shows marked ADC
hypointensity and DWI hyperintensity, as well as early focal
contrast enhancement. PI-RADS 4

An adjacent T2 hypointense nodule measuring 4 x 4 mm is seen in the
right anterior apex on image [DATE]. This lesion shows marked ADC
hypointensity and early focal contrast enhancement, but lack of DWI
hyperintensity. PI-RADS 4

A 5 x 4 mm T2 hypointense nodule is seen in the left lateral mid
gland on image 43/9. This nodule shows marked ADC hypointensity, but
lack of DWI hyperintensity or early focal contrast enhancement.
PI-RADS 3

-- Transition/Central Zone: Slightly enlarged with a few small
circumscribed BPH nodules. No non-circumscribed or otherwise
suspicious appearing nodules identified.

-- Measurements/Volume:  3.7 x 2.9 x 3.9 cm (volume = 22 cm^3)

Transcapsular spread:  Absent

Seminal vesicle involvement:  Absent

Neurovascular bundle involvement:  Absent

Pelvic adenopathy: None visualized

Bone metastasis: None visualized

Other:  Sigmoid diverticulosis, without evidence of diverticulitis.
IMPRESSION: Two adjacent sub-cm peripheral zone nodules in the right anterior
mid gland and apex, both suspicious for high-grade carcinoma.
PI-RADS 4 (v2.1): High (clinically significant cancer likely)

5 mm peripheral zone nodule in the left lateral mid gland. PI-RADS 3
(v2.1): Intermediate (clinically significant cancer equivocal)

No evidence of extracapsular extension or pelvic metastatic disease.

(I have post-processed this exam in the DynaCAD application for
potential fusion-guided biopsy.)

## 2023-05-04 DIAGNOSIS — L03119 Cellulitis of unspecified part of limb: Secondary | ICD-10-CM | POA: Diagnosis not present

## 2023-05-30 DIAGNOSIS — R7989 Other specified abnormal findings of blood chemistry: Secondary | ICD-10-CM | POA: Diagnosis not present

## 2023-05-30 DIAGNOSIS — N281 Cyst of kidney, acquired: Secondary | ICD-10-CM | POA: Diagnosis not present

## 2023-05-30 DIAGNOSIS — R748 Abnormal levels of other serum enzymes: Secondary | ICD-10-CM | POA: Diagnosis not present

## 2023-06-26 DIAGNOSIS — N492 Inflammatory disorders of scrotum: Secondary | ICD-10-CM | POA: Diagnosis not present

## 2023-06-26 DIAGNOSIS — Z23 Encounter for immunization: Secondary | ICD-10-CM | POA: Diagnosis not present

## 2023-06-26 DIAGNOSIS — L02414 Cutaneous abscess of left upper limb: Secondary | ICD-10-CM | POA: Diagnosis not present

## 2023-07-05 ENCOUNTER — Other Ambulatory Visit: Payer: Self-pay | Admitting: Nurse Practitioner

## 2023-07-05 DIAGNOSIS — C61 Malignant neoplasm of prostate: Secondary | ICD-10-CM

## 2023-07-12 DIAGNOSIS — B3749 Other urogenital candidiasis: Secondary | ICD-10-CM | POA: Diagnosis not present

## 2023-07-12 DIAGNOSIS — Z23 Encounter for immunization: Secondary | ICD-10-CM | POA: Diagnosis not present

## 2023-07-12 DIAGNOSIS — J3489 Other specified disorders of nose and nasal sinuses: Secondary | ICD-10-CM | POA: Diagnosis not present

## 2023-07-13 DIAGNOSIS — N492 Inflammatory disorders of scrotum: Secondary | ICD-10-CM | POA: Diagnosis not present

## 2023-07-16 DIAGNOSIS — L03211 Cellulitis of face: Secondary | ICD-10-CM | POA: Diagnosis not present

## 2023-07-16 DIAGNOSIS — B356 Tinea cruris: Secondary | ICD-10-CM | POA: Diagnosis not present

## 2023-07-30 DIAGNOSIS — R600 Localized edema: Secondary | ICD-10-CM | POA: Diagnosis not present

## 2023-07-30 DIAGNOSIS — L0291 Cutaneous abscess, unspecified: Secondary | ICD-10-CM | POA: Diagnosis not present

## 2023-07-30 DIAGNOSIS — I2510 Atherosclerotic heart disease of native coronary artery without angina pectoris: Secondary | ICD-10-CM | POA: Diagnosis not present

## 2023-08-09 DIAGNOSIS — Z9181 History of falling: Secondary | ICD-10-CM | POA: Diagnosis not present

## 2023-08-09 DIAGNOSIS — Z139 Encounter for screening, unspecified: Secondary | ICD-10-CM | POA: Diagnosis not present

## 2023-08-09 DIAGNOSIS — I700 Atherosclerosis of aorta: Secondary | ICD-10-CM | POA: Diagnosis not present

## 2023-08-09 DIAGNOSIS — Z1331 Encounter for screening for depression: Secondary | ICD-10-CM | POA: Diagnosis not present

## 2023-08-09 DIAGNOSIS — Z0000 Encounter for general adult medical examination without abnormal findings: Secondary | ICD-10-CM | POA: Diagnosis not present

## 2023-08-15 ENCOUNTER — Encounter: Payer: Self-pay | Admitting: Infectious Disease

## 2023-08-15 DIAGNOSIS — L0291 Cutaneous abscess, unspecified: Secondary | ICD-10-CM

## 2023-08-15 HISTORY — DX: Cutaneous abscess, unspecified: L02.91

## 2023-08-15 NOTE — Progress Notes (Signed)
   Subjective:   Reason for Infectious Disease Consult:  Requesting Physician: Beckey Downing, NP   Patient ID: Duane Webster, male    DOB: 06/13/1951, 72 y.o.   MRN: [id number]  HPI  Past Medical History:  Diagnosis Date   Basal cell carcinoma    right side face   Hypertension     Past Surgical History:  Procedure Laterality Date   COLONOSCOPY     UMBILICAL HERNIA REPAIR  2006    Family History  Problem Relation Age of Onset   Colon cancer Neg Hx       Social History   Socioeconomic History   Marital status: Married    Spouse name: Not on file   Number of children: Not on file   Years of education: Not on file   Highest education level: Not on file  Occupational History   Not on file  Tobacco Use   Smoking status: Every Day    Current packs/day: 1.00    Types: Cigarettes   Smokeless tobacco: Never  Vaping Use   Vaping status: Never Used  Substance and Sexual Activity   Alcohol use: Yes    Alcohol/week: 4.0 standard drinks of alcohol    Types: 4 Cans of beer per week   Drug use: No   Sexual activity: Not on file  Other Topics Concern   Not on file  Social History Narrative   Not on file   Social Determinants of Health   Financial Resource Strain: Not on file  Food Insecurity: Not on file  Transportation Needs: Not on file  Physical Activity: Not on file  Stress: Not on file  Social Connections: Not on file    Allergies  Allergen Reactions   Codeine     Causes "crazy dreams" and nightmares/ Nausea and vomiting     Current Outpatient Medications:    losartan (COZAAR) 25 MG tablet, Take 25 mg by mouth daily., Disp: , Rfl:    PFIZER COVID-19 VAC BIVALENT injection, , Disp: , Rfl:   Current Facility-Administered Medications:    0.9 %  sodium chloride infusion, 500 mL, Intravenous, Continuous, Nandigam, Eleonore Chiquito, MD   Review of Systems     Objective:   Physical Exam        Assessment & Plan:

## 2023-08-16 ENCOUNTER — Ambulatory Visit: Payer: Medicare HMO | Admitting: Infectious Disease

## 2023-08-16 ENCOUNTER — Encounter: Payer: Self-pay | Admitting: Infectious Disease

## 2023-08-16 ENCOUNTER — Other Ambulatory Visit: Payer: Self-pay

## 2023-08-16 VITALS — BP 159/81 | HR 71 | Temp 97.3°F | Ht 67.0 in | Wt 209.0 lb

## 2023-08-16 DIAGNOSIS — Z23 Encounter for immunization: Secondary | ICD-10-CM | POA: Diagnosis not present

## 2023-08-16 DIAGNOSIS — A4901 Methicillin susceptible Staphylococcus aureus infection, unspecified site: Secondary | ICD-10-CM | POA: Diagnosis not present

## 2023-08-16 DIAGNOSIS — L0889 Other specified local infections of the skin and subcutaneous tissue: Secondary | ICD-10-CM

## 2023-08-16 DIAGNOSIS — L0291 Cutaneous abscess, unspecified: Secondary | ICD-10-CM | POA: Diagnosis not present

## 2023-08-16 DIAGNOSIS — Z1159 Encounter for screening for other viral diseases: Secondary | ICD-10-CM

## 2023-08-16 DIAGNOSIS — Z114 Encounter for screening for human immunodeficiency virus [HIV]: Secondary | ICD-10-CM | POA: Diagnosis not present

## 2023-08-16 HISTORY — DX: Methicillin susceptible Staphylococcus aureus infection, unspecified site: A49.01

## 2023-08-16 MED ORDER — CEFADROXIL 500 MG PO CAPS: 1000.0000 mg | ORAL_CAPSULE | Freq: Two times a day (BID) | ORAL | 1 refills | Status: AC

## 2023-08-16 MED ORDER — MUPIROCIN 2 % EX OINT: 1.0000 | TOPICAL_OINTMENT | Freq: Two times a day (BID) | CUTANEOUS | 2 refills | Status: AC

## 2023-08-16 NOTE — Patient Instructions (Signed)
After 2 weeks I want you to shower head to toe with Hibiclens (or generic equivalent), not apply deodorant or makeup , creams for 1 hour afterwards  During this week also apply mupirocin TWICE daily inside both nostrils (coat them thoroughly)  I would like your wife (if she is agreeable) to do the same regimen with plan for the week of decolonization to avoid physical contact  The idea is that your wife could be colonized but not symptomatic with MSSA like you are

## 2023-08-17 LAB — BASIC METABOLIC PANEL WITH GFR
BUN: 18 mg/dL (ref 7–25)
CO2: 26 mmol/L (ref 20–32)
Calcium: 9.5 mg/dL (ref 8.6–10.3)
Chloride: 103 mmol/L (ref 98–110)
Creat: 0.97 mg/dL (ref 0.70–1.28)
Glucose, Bld: 90 mg/dL (ref 65–99)
Potassium: 4.3 mmol/L (ref 3.5–5.3)
Sodium: 139 mmol/L (ref 135–146)
eGFR: 83 mL/min/{1.73_m2} (ref >=60)

## 2023-08-17 LAB — CBC WITH DIFFERENTIAL/PLATELET
Absolute Lymphocytes: 2515 {cells}/uL (ref 850–3900)
Absolute Monocytes: 576 {cells}/uL (ref 200–950)
Basophils Absolute: 58 {cells}/uL (ref 0–200)
Basophils Relative: 0.9 %
Eosinophils Absolute: 102 {cells}/uL (ref 15–500)
Eosinophils Relative: 1.6 %
HCT: 43 % (ref 38.5–50.0)
Hemoglobin: 14.3 g/dL (ref 13.2–17.1)
MCH: 32.6 pg (ref 27.0–33.0)
MCHC: 33.3 g/dL (ref 32.0–36.0)
MCV: 97.9 fL (ref 80.0–100.0)
MPV: 11.5 fL (ref 7.5–12.5)
Monocytes Relative: 9 %
Neutro Abs: 3149 {cells}/uL (ref 1500–7800)
Neutrophils Relative %: 49.2 %
Platelets: 198 10*3/uL (ref 140–400)
RBC: 4.39 10*6/uL (ref 4.20–5.80)
RDW: 11.8 % (ref 11.0–15.0)
Total Lymphocyte: 39.3 %
WBC: 6.4 10*3/uL (ref 3.8–10.8)

## 2023-08-17 LAB — HEPATITIS C AB W/RFL RNA, PCR + GENO: Hepatitis C Ab: NONREACTIVE

## 2023-08-17 LAB — HEPATITIS A ANTIBODY, TOTAL: Hepatitis A AB,Total: REACTIVE — AB

## 2023-08-17 LAB — HIV ANTIBODY (ROUTINE TESTING W REFLEX): HIV 1&2 Ab, 4th Generation: NONREACTIVE

## 2023-08-17 LAB — HEPATITIS B SURFACE ANTIGEN: Hepatitis B Surface Ag: NONREACTIVE

## 2023-08-17 LAB — HEPATITIS B SURFACE ANTIBODY, QUANTITATIVE: Hep B S AB Quant (Post): <5 m[IU]/mL — ABNORMAL LOW (ref >=10)

## 2023-09-04 ENCOUNTER — Encounter: Payer: Self-pay | Admitting: Nurse Practitioner

## 2023-09-06 ENCOUNTER — Inpatient Hospital Stay
Admission: RE | Admit: 2023-09-06 | Discharge: 2023-09-06 | Discharge status: Home / Self Care | Payer: Medicare HMO | Source: Ambulatory Visit | Attending: Nurse Practitioner

## 2023-09-06 DIAGNOSIS — C61 Malignant neoplasm of prostate: Secondary | ICD-10-CM

## 2023-09-06 DIAGNOSIS — R972 Elevated prostate specific antigen [PSA]: Secondary | ICD-10-CM | POA: Diagnosis not present

## 2023-09-06 MED ORDER — GADOPICLENOL 0.5 MMOL/ML IV SOLN
9.5000 mL | Freq: Once | INTRAVENOUS | Status: AC | PRN
Start: 2023-09-06 — End: 2023-09-06
  Administered 2023-09-06: 9.5 mL via INTRAVENOUS

## 2023-09-13 DIAGNOSIS — C61 Malignant neoplasm of prostate: Secondary | ICD-10-CM | POA: Diagnosis not present

## 2023-09-14 DIAGNOSIS — C61 Malignant neoplasm of prostate: Secondary | ICD-10-CM | POA: Diagnosis not present

## 2023-09-14 DIAGNOSIS — I2510 Atherosclerotic heart disease of native coronary artery without angina pectoris: Secondary | ICD-10-CM | POA: Diagnosis not present

## 2023-09-14 DIAGNOSIS — Z87891 Personal history of nicotine dependence: Secondary | ICD-10-CM | POA: Diagnosis not present

## 2023-09-14 DIAGNOSIS — K760 Fatty (change of) liver, not elsewhere classified: Secondary | ICD-10-CM | POA: Diagnosis not present

## 2023-09-14 DIAGNOSIS — I700 Atherosclerosis of aorta: Secondary | ICD-10-CM | POA: Diagnosis not present

## 2023-09-14 DIAGNOSIS — M25562 Pain in left knee: Secondary | ICD-10-CM | POA: Diagnosis not present

## 2023-09-14 DIAGNOSIS — J439 Emphysema, unspecified: Secondary | ICD-10-CM | POA: Diagnosis not present

## 2023-09-14 DIAGNOSIS — I10 Essential (primary) hypertension: Secondary | ICD-10-CM | POA: Diagnosis not present

## 2023-09-20 DIAGNOSIS — C61 Malignant neoplasm of prostate: Secondary | ICD-10-CM | POA: Diagnosis not present

## 2023-10-04 DIAGNOSIS — M25562 Pain in left knee: Secondary | ICD-10-CM | POA: Diagnosis not present

## 2023-10-15 ENCOUNTER — Encounter: Payer: Self-pay | Admitting: Infectious Disease

## 2023-10-15 DIAGNOSIS — Z7185 Encounter for immunization safety counseling: Secondary | ICD-10-CM | POA: Insufficient documentation

## 2023-10-15 DIAGNOSIS — B999 Unspecified infectious disease: Secondary | ICD-10-CM | POA: Insufficient documentation

## 2023-10-15 HISTORY — DX: Unspecified infectious disease: B99.9

## 2023-10-15 HISTORY — DX: Encounter for immunization safety counseling: Z71.85

## 2023-10-15 NOTE — Progress Notes (Unsigned)
Subjective:  Chief complaint: followup for recurrent soft tissue infections   Patient ID: Duane Webster, male    DOB: June 29, 1951, 73 y.o.   MRN: [id number]  HPI  73 year old man who has had a history of precancerous lesion on the prostate who had recurrent skin infections since last summer as detailed my past note.  His cultures that have been taken from nose and from wounds had grown methicillin sensitive Staphylococcus aureus and had responded both Augmentin and doxycycline.  We ended up giving him cefadroxil twice daily 1000 mg twice daily for a month and then after 2 weeks into the cefadroxil had him do a decolonization regimen with Hibiclens showers and intranasal mupirocin x 7 days while his wife did this in concert with him while avoiding physical contact.  He has not had recurrence of any skin lesions since then but has also been continue to take the cefadroxil which she can now stop.    Past Medical History:  Diagnosis Date   Abscess 08/15/2023   Basal cell carcinoma    right side face   Hypertension    MSSA (methicillin susceptible Staphylococcus aureus) infection 08/16/2023    Past Surgical History:  Procedure Laterality Date   COLONOSCOPY     UMBILICAL HERNIA REPAIR  2006    Family History  Problem Relation Age of Onset   Colon cancer Neg Hx       Social History   Socioeconomic History   Marital status: Married    Spouse name: Not on file   Number of children: Not on file   Years of education: Not on file   Highest education level: Not on file  Occupational History   Not on file  Tobacco Use   Smoking status: Every Day    Current packs/day: 1.00    Types: Cigarettes   Smokeless tobacco: Never  Vaping Use   Vaping status: Never Used  Substance and Sexual Activity   Alcohol use: Yes    Alcohol/week: 4.0 standard drinks of alcohol    Types: 4 Cans of beer per week   Drug use: No   Sexual activity: Not on file  Other Topics Concern   Not on  file  Social History Narrative   Not on file   Social Drivers of Health   Financial Resource Strain: Not on file  Food Insecurity: Not on file  Transportation Needs: Not on file  Physical Activity: Not on file  Stress: Not on file  Social Connections: Not on file    Allergies  Allergen Reactions   Codeine     Causes "crazy dreams" and nightmares/ Nausea and vomiting     Current Outpatient Medications:    cefadroxil (DURICEF) 500 MG capsule, Take 2 capsules (1,000 mg total) by mouth 2 (two) times daily., Disp: 120 capsule, Rfl: 1   losartan (COZAAR) 25 MG tablet, Take 25 mg by mouth daily., Disp: , Rfl:    mupirocin ointment (BACTROBAN) 2 %, Apply 1 Application topically 2 (two) times daily., Disp: 30 g, Rfl: 2   rosuvastatin (CRESTOR) 10 MG tablet, Take 10 mg by mouth at bedtime., Disp: , Rfl:    Review of Systems  Constitutional:  Negative for activity change, appetite change, chills, diaphoresis, fatigue, fever and unexpected weight change.  HENT:  Negative for congestion, rhinorrhea, sinus pressure, sneezing, sore throat and trouble swallowing.   Eyes:  Negative for photophobia and visual disturbance.  Respiratory:  Negative for cough, chest tightness, shortness of breath, wheezing  and stridor.   Cardiovascular:  Negative for chest pain, palpitations and leg swelling.  Gastrointestinal:  Negative for abdominal distention, abdominal pain, anal bleeding, blood in stool, constipation, diarrhea, nausea and vomiting.  Genitourinary:  Negative for difficulty urinating, dysuria, flank pain and hematuria.  Musculoskeletal:  Negative for arthralgias, back pain, gait problem, joint swelling and myalgias.  Skin:  Negative for color change, pallor, rash and wound.  Neurological:  Negative for dizziness, tremors, weakness and light-headedness.  Hematological:  Negative for adenopathy. Does not bruise/bleed easily.  Psychiatric/Behavioral:  Negative for agitation, behavioral problems,  confusion, decreased concentration, dysphoric mood and sleep disturbance.        Objective:   Physical Exam Constitutional:      Appearance: He is well-developed.  HENT:     Head: Normocephalic and atraumatic.  Eyes:     Conjunctiva/sclera: Conjunctivae normal.  Cardiovascular:     Rate and Rhythm: Normal rate and regular rhythm.  Pulmonary:     Effort: Pulmonary effort is normal. No respiratory distress.     Breath sounds: No wheezing.  Abdominal:     General: There is no distension.     Palpations: Abdomen is soft.  Musculoskeletal:        General: No tenderness. Normal range of motion.     Cervical back: Normal range of motion and neck supple.  Skin:    General: Skin is warm and dry.     Coloration: Skin is not pale.     Findings: No erythema or rash.  Neurological:     General: No focal deficit present.     Mental Status: He is alert and oriented to person, place, and time.  Psychiatric:        Mood and Affect: Mood normal.        Behavior: Behavior normal.        Thought Content: Thought content normal.        Judgment: Judgment normal.           Assessment & Plan:   Recurrent skin infections:  Hopeful that by giving him a more protracted course of systemic antibiotics along with a decolonization regimen that we will not see recurrence if we does though it certainly would be happy to see him again in that situation would probably try to use bleach baths as a next intervention.  Lack of hepatitis B immunity had discussion with regards to risks and benefits of hepatitis B vaccination.  YM certainly and always in favor of vaccination I do not feel strongly about him needing hep B vaccination because he does not seem to have much in the way of a occupational or other risk for acquiring hepatitis B  I have personally spent 26 minutes involved in face-to-face and non-face-to-face activities for this patient on the day of the visit. Professional time spent includes the  following activities: Preparing to see the patient (review of tests), Obtaining and/or reviewing separately obtained history (admission/discharge record), Performing a medically appropriate examination and/or evaluation , Ordering medications/tests/procedures, referring and communicating with other health care professionals, Documenting clinical information in the EMR, Independently interpreting results (not separately reported), Communicating results to the patient/family/caregiver, Counseling and educating the patient/family/caregiver and Care coordination (not separately reported).

## 2023-10-16 ENCOUNTER — Encounter: Payer: Self-pay | Admitting: Infectious Disease

## 2023-10-16 ENCOUNTER — Other Ambulatory Visit: Payer: Self-pay

## 2023-10-16 ENCOUNTER — Ambulatory Visit: Payer: Medicare HMO | Admitting: Infectious Disease

## 2023-10-16 VITALS — BP 145/82 | HR 68 | Temp 97.8°F | Ht 68.0 in | Wt 209.0 lb

## 2023-10-16 DIAGNOSIS — B999 Unspecified infectious disease: Secondary | ICD-10-CM

## 2023-10-16 DIAGNOSIS — C61 Malignant neoplasm of prostate: Secondary | ICD-10-CM | POA: Diagnosis not present

## 2023-10-16 DIAGNOSIS — A4901 Methicillin susceptible Staphylococcus aureus infection, unspecified site: Secondary | ICD-10-CM

## 2023-10-16 DIAGNOSIS — Z7185 Encounter for immunization safety counseling: Secondary | ICD-10-CM | POA: Diagnosis not present

## 2023-10-23 DIAGNOSIS — Z87891 Personal history of nicotine dependence: Secondary | ICD-10-CM | POA: Diagnosis not present

## 2023-10-23 DIAGNOSIS — Z0489 Encounter for examination and observation for other specified reasons: Secondary | ICD-10-CM | POA: Diagnosis not present

## 2023-10-23 DIAGNOSIS — Z122 Encounter for screening for malignant neoplasm of respiratory organs: Secondary | ICD-10-CM | POA: Diagnosis not present

## 2024-01-29 ENCOUNTER — Ambulatory Visit: Payer: Medicare HMO | Admitting: Dermatology

## 2024-01-29 ENCOUNTER — Encounter: Payer: Self-pay | Admitting: Dermatology

## 2024-01-29 VITALS — BP 145/92

## 2024-01-29 DIAGNOSIS — W908XXA Exposure to other nonionizing radiation, initial encounter: Secondary | ICD-10-CM | POA: Diagnosis not present

## 2024-01-29 DIAGNOSIS — D485 Neoplasm of uncertain behavior of skin: Secondary | ICD-10-CM

## 2024-01-29 DIAGNOSIS — L814 Other melanin hyperpigmentation: Secondary | ICD-10-CM | POA: Diagnosis not present

## 2024-01-29 DIAGNOSIS — Z1283 Encounter for screening for malignant neoplasm of skin: Secondary | ICD-10-CM

## 2024-01-29 DIAGNOSIS — D229 Melanocytic nevi, unspecified: Secondary | ICD-10-CM

## 2024-01-29 DIAGNOSIS — L578 Other skin changes due to chronic exposure to nonionizing radiation: Secondary | ICD-10-CM | POA: Diagnosis not present

## 2024-01-29 DIAGNOSIS — L821 Other seborrheic keratosis: Secondary | ICD-10-CM | POA: Diagnosis not present

## 2024-01-29 DIAGNOSIS — D492 Neoplasm of unspecified behavior of bone, soft tissue, and skin: Secondary | ICD-10-CM | POA: Diagnosis not present

## 2024-01-29 DIAGNOSIS — L570 Actinic keratosis: Secondary | ICD-10-CM | POA: Diagnosis not present

## 2024-01-29 DIAGNOSIS — D1801 Hemangioma of skin and subcutaneous tissue: Secondary | ICD-10-CM

## 2024-01-29 DIAGNOSIS — L905 Scar conditions and fibrosis of skin: Secondary | ICD-10-CM

## 2024-01-29 DIAGNOSIS — Z85828 Personal history of other malignant neoplasm of skin: Secondary | ICD-10-CM

## 2024-01-29 NOTE — Patient Instructions (Addendum)

## 2024-01-29 NOTE — Progress Notes (Unsigned)
 New Patient Visit   Subjective  Duane Webster is a 73 y.o. male who presents for the following:  Total Body Skin Exam (TBSE)  Patient present today for new patient visit for TBSE.The patient denies he  has spots, moles and lesions to be evaluated, some may be new or changing and the patient may have concern these could be cancer. Patient has previously been treated by a dermatologist. Patient reports he  has hx of bx proven BCC. Patient denies family history of skin cancers. Patient reports throughout his lifetime has had complete sun exposure. Currently, patient reports if he  has excessive sun exposure, he  does apply sunscreen and/or wears protective coverings.  The following portions of the chart were reviewed this encounter and updated as appropriate: medications, allergies, medical history  Review of Systems:  No other skin or systemic complaints except as noted in HPI or Assessment and Plan.  Objective  Well appearing patient in no apparent distress; mood and affect are within normal limits.  A full examination was performed including scalp, head, eyes, ears, nose, lips, neck, chest, axillae, abdomen, back, buttocks, bilateral upper extremities, bilateral lower extremities, hands, feet, fingers, toes, fingernails, and toenails. All findings within normal limits unless otherwise noted below.    Relevant exam findings are noted in the Assessment and Plan.       Left Helix, L antihelic, Mid occipital scalp, R cheek (5) Erythematous thin papules/macules with gritty scale.  Left Forearm - Anterior 1cm pink scorotic plaque   Assessment & Plan   LENTIGINES, SEBORRHEIC KERATOSES, HEMANGIOMAS - Benign normal skin lesions - Benign-appearing - Call for any changes  BENIGN MELANOCYTIC NEVI - Tan-brown and/or pink-flesh-colored symmetric macules and papules - Benign appearing on exam today - Observation - Call clinic for new or changing moles - Recommend daily use of broad  spectrum spf 30+ sunscreen to sun-exposed areas.   MILD ACTINIC DAMAGE - Chronic condition, secondary to cumulative UV/sun exposure - diffuse scaly erythematous macules with underlying dyspigmentation - Recommend daily broad spectrum sunscreen SPF 30+ to sun-exposed areas, reapply every 2 hours as needed.  - Staying in the shade or wearing long sleeves, sun glasses (UVA+UVB protection) and wide brim hats (4-inch brim around the entire circumference of the hat) are also recommended for sun protection.  - Call for new or changing lesions.  HISTORY OF BASAL CELL CARCINOMA OF THE SKIN - No evidence of recurrence today - Recommend regular full body skin exams - Recommend daily broad spectrum sunscreen SPF 30+ to sun-exposed areas, reapply every 2 hours as needed.  - Call if any new or changing lesions are noted between office visits   SKIN CANCER SCREENING PERFORMED TODAY  AK (ACTINIC KERATOSIS) (5) Left Helix, L antihelic, Mid occipital scalp, R cheek (5) Destruction of lesion - Left Helix, L antihelic, Mid occipital scalp, R cheek (5) Complexity: simple   Destruction method: cryotherapy   Informed consent: discussed and consent obtained   Timeout:  patient name, date of birth, surgical site, and procedure verified Lesion destroyed using liquid nitrogen: Yes   Post-procedure details: wound care instructions given   NEOPLASM OF UNCERTAIN BEHAVIOR OF SKIN Left Forearm - Anterior Skin / nail biopsy Type of biopsy: tangential   Informed consent: discussed and consent obtained   Timeout: patient name, date of birth, surgical site, and procedure verified   Procedure prep:  Patient was prepped and draped in usual sterile fashion Prep type:  Isopropyl alcohol Anesthesia: the lesion was  anesthetized in a standard fashion   Anesthetic:  1% lidocaine w/ epinephrine 1-100,000 buffered w/ 8.4% NaHCO3 Instrument used: DermaBlade   Hemostasis achieved with: aluminum chloride   Outcome: patient  tolerated procedure well   Post-procedure details: sterile dressing applied and wound care instructions given   Dressing type: petrolatum gauze and bandage    Return in about 1 year (around 01/28/2025) for TBSE.   Documentation: I have reviewed the above documentation for accuracy and completeness, and I agree with the above.  I, Shirron Louanne Roussel, CMA, am acting as scribe for [employer], DO.   Louana Roup, DO

## 2024-02-04 LAB — SURGICAL PATHOLOGY

## 2024-02-05 ENCOUNTER — Ambulatory Visit: Payer: Self-pay | Admitting: Dermatology

## 2024-02-05 NOTE — Progress Notes (Signed)
 Hi Early  Dr. Myrtie Atkinson reviewed your biopsy results and they showed the spot removed was a little "abnormal" but not cancerous.  No additional treatment is required.  We will continue to monitor the area for during your annual skin exams. The detailed report is available to view in MyChart.  Have a great day!  Kind Regards,  Dr. Christiane Cowing Care Team

## 2024-03-13 DIAGNOSIS — C61 Malignant neoplasm of prostate: Secondary | ICD-10-CM | POA: Diagnosis not present

## 2024-03-19 DIAGNOSIS — J439 Emphysema, unspecified: Secondary | ICD-10-CM | POA: Diagnosis not present

## 2024-03-19 DIAGNOSIS — I2510 Atherosclerotic heart disease of native coronary artery without angina pectoris: Secondary | ICD-10-CM | POA: Diagnosis not present

## 2024-03-19 DIAGNOSIS — I10 Essential (primary) hypertension: Secondary | ICD-10-CM | POA: Diagnosis not present

## 2024-03-19 DIAGNOSIS — Z23 Encounter for immunization: Secondary | ICD-10-CM | POA: Diagnosis not present

## 2024-03-19 DIAGNOSIS — K760 Fatty (change of) liver, not elsewhere classified: Secondary | ICD-10-CM | POA: Diagnosis not present

## 2024-03-19 DIAGNOSIS — C61 Malignant neoplasm of prostate: Secondary | ICD-10-CM | POA: Diagnosis not present

## 2024-03-20 DIAGNOSIS — R351 Nocturia: Secondary | ICD-10-CM | POA: Diagnosis not present

## 2024-03-20 DIAGNOSIS — C61 Malignant neoplasm of prostate: Secondary | ICD-10-CM | POA: Diagnosis not present

## 2024-07-29 DIAGNOSIS — Z23 Encounter for immunization: Secondary | ICD-10-CM | POA: Diagnosis not present

## 2024-08-05 ENCOUNTER — Other Ambulatory Visit: Payer: Self-pay | Admitting: Nurse Practitioner

## 2024-08-05 DIAGNOSIS — C61 Malignant neoplasm of prostate: Secondary | ICD-10-CM

## 2024-08-06 ENCOUNTER — Encounter: Payer: Self-pay | Admitting: Nurse Practitioner

## 2024-09-11 ENCOUNTER — Inpatient Hospital Stay: Admission: RE | Admit: 2024-09-11 | Discharge: 2024-09-11 | Attending: Nurse Practitioner

## 2024-09-11 DIAGNOSIS — C61 Malignant neoplasm of prostate: Secondary | ICD-10-CM

## 2024-09-11 MED ORDER — GADOPICLENOL 0.5 MMOL/ML IV SOLN
10.0000 mL | Freq: Once | INTRAVENOUS | Status: AC | PRN
  Administered 2024-09-11: 15:00:00 10 mL via INTRAVENOUS

## 2025-01-29 ENCOUNTER — Ambulatory Visit: Admitting: Dermatology
